# Patient Record
Sex: Male | Born: 1970 | Marital: Married | Smoking: Former smoker
Health system: Southern US, Community
[De-identification: ages and names within clinical notes are randomized; demographics above are authoritative.]

## PROBLEM LIST (undated history)

## (undated) DIAGNOSIS — IMO0001 Reserved for inherently not codable concepts without codable children: Secondary | ICD-10-CM

## (undated) DIAGNOSIS — J302 Other seasonal allergic rhinitis: Secondary | ICD-10-CM

## (undated) DIAGNOSIS — S0291XA Unspecified fracture of skull, initial encounter for closed fracture: Secondary | ICD-10-CM

## (undated) DIAGNOSIS — E785 Hyperlipidemia, unspecified: Secondary | ICD-10-CM

## (undated) DIAGNOSIS — R030 Elevated blood-pressure reading, without diagnosis of hypertension: Secondary | ICD-10-CM

## (undated) HISTORY — DX: Other seasonal allergic rhinitis: J30.2

## (undated) HISTORY — DX: Reserved for inherently not codable concepts without codable children: IMO0001

## (undated) HISTORY — PX: STRABISMUS SURGERY: SHX218

## (undated) HISTORY — DX: Elevated blood-pressure reading, without diagnosis of hypertension: R03.0

## (undated) HISTORY — DX: Hyperlipidemia, unspecified: E78.5

## (undated) HISTORY — DX: Unspecified fracture of skull, initial encounter for closed fracture: S02.91XA

---

## 2007-02-08 ENCOUNTER — Encounter (INDEPENDENT_AMBULATORY_CARE_PROVIDER_SITE_OTHER): Payer: Self-pay | Admitting: *Deleted

## 2007-02-08 ENCOUNTER — Ambulatory Visit: Payer: Self-pay | Admitting: Family Medicine

## 2007-02-08 DIAGNOSIS — E785 Hyperlipidemia, unspecified: Secondary | ICD-10-CM | POA: Insufficient documentation

## 2007-02-08 DIAGNOSIS — R030 Elevated blood-pressure reading, without diagnosis of hypertension: Secondary | ICD-10-CM | POA: Insufficient documentation

## 2007-02-08 LAB — CONVERTED CEMR LAB
CO2: 29 meq/L (ref 19–32)
Cholesterol: 215 mg/dL (ref 0–200)
Creatinine, Ser: 1.1 mg/dL (ref 0.4–1.5)
Direct LDL: 160.5 mg/dL
Glucose, Bld: 88 mg/dL (ref 70–99)
HDL: 34.9 mg/dL — ABNORMAL LOW (ref >39.0)
Potassium: 4.1 meq/L (ref 3.5–5.1)
Sodium: 139 meq/L (ref 135–145)
Total Bilirubin: 1.5 mg/dL — ABNORMAL HIGH (ref 0.3–1.2)
Total Protein: 7.1 g/dL (ref 6.0–8.3)

## 2007-05-16 DIAGNOSIS — S0291XA Unspecified fracture of skull, initial encounter for closed fracture: Secondary | ICD-10-CM

## 2007-05-16 HISTORY — DX: Unspecified fracture of skull, initial encounter for closed fracture: S02.91XA

## 2007-08-31 ENCOUNTER — Emergency Department: Payer: Self-pay | Admitting: Emergency Medicine

## 2009-03-18 IMAGING — CT CT HEAD WITHOUT CONTRAST
2 of 4 series · 15 of 30 positions shown, 18 images · non-contrast
Comparison: none

REASON FOR EXAM: closed head injury
COMMENTS:

PROCEDURE:     CT  - CT HEAD WITHOUT CONTRAST  - August 31, 2007  [DATE]
RESULT:     Comparison: No available comparison exam.
Procedure: CT examination of the head was performed without intravenous
contrast. Collimation is 5 mm. 2 mm axial, coronal, and sagittal reformats
were made.

[Series 4: bone · axial · 0.48mm/px · z∈[-37,+41]mm · 5 of 115 slices shown]
[im 11/115  bone]
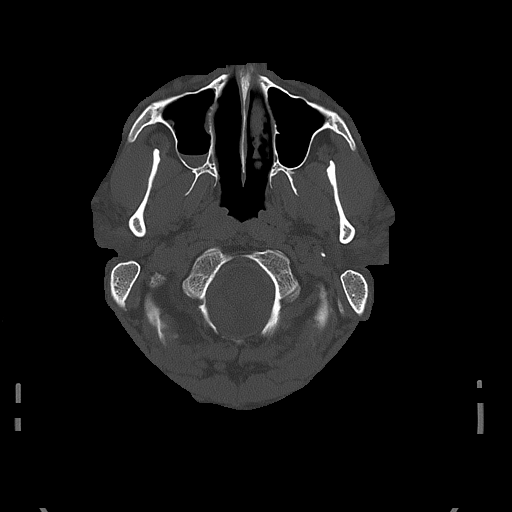
[im 21/115  bone]
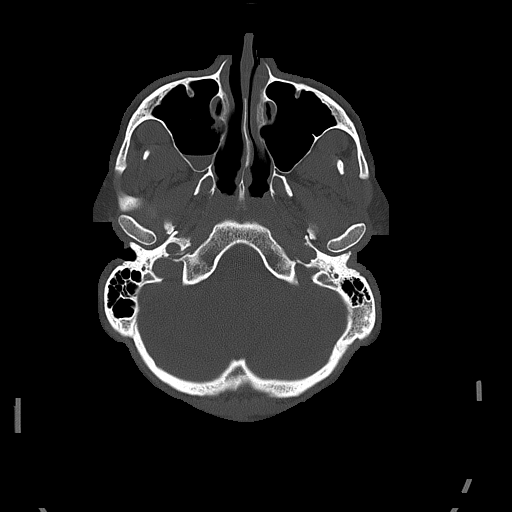
[im 42/115  bone]
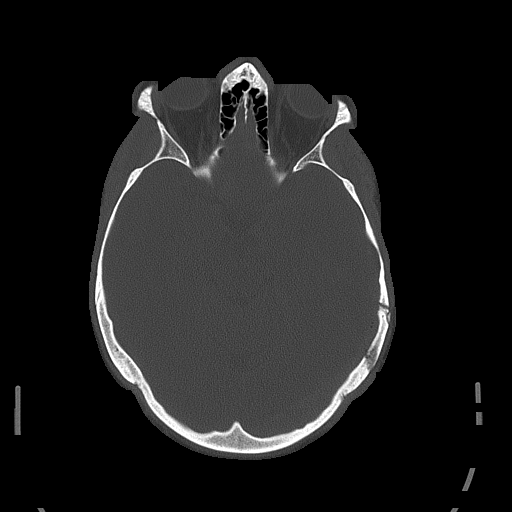
[im 52/115  bone]
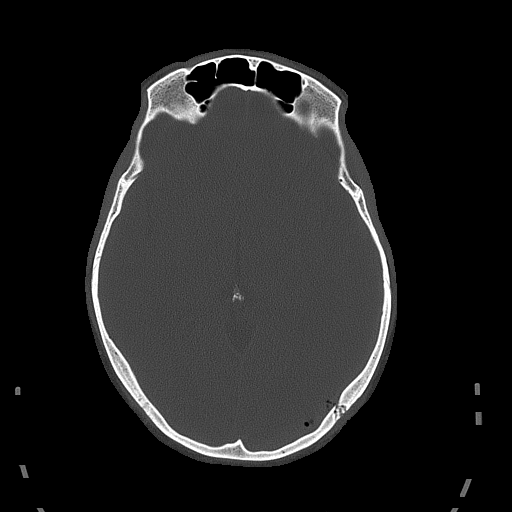
[im 63/115  bone]
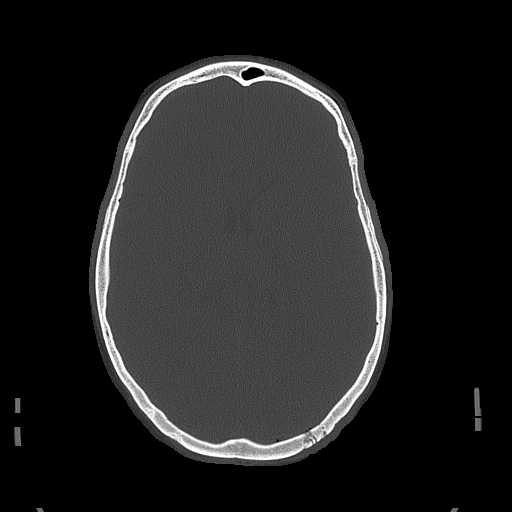

[Series 5: without thin · axial · non-contrast · 0.48mm/px · z∈[-37,+102]mm · 10 of 115 slices shown, 13 images]
[im 11/115  brain]
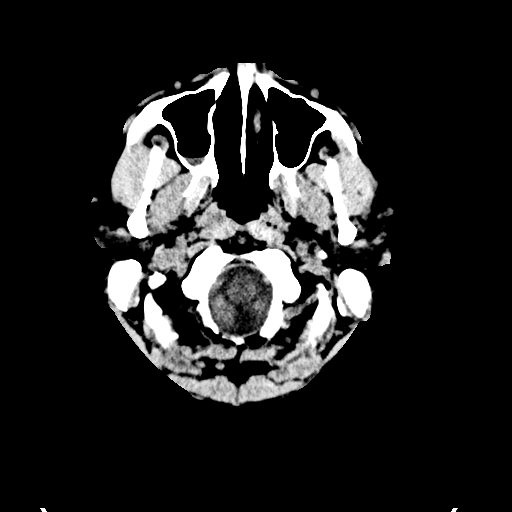
[im 11/115  bone]
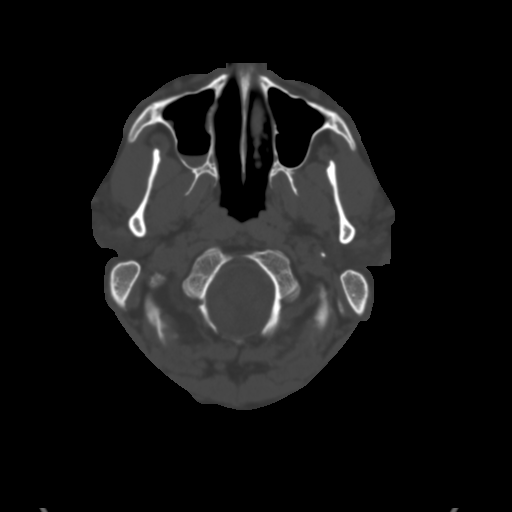
[im 21/115  brain]
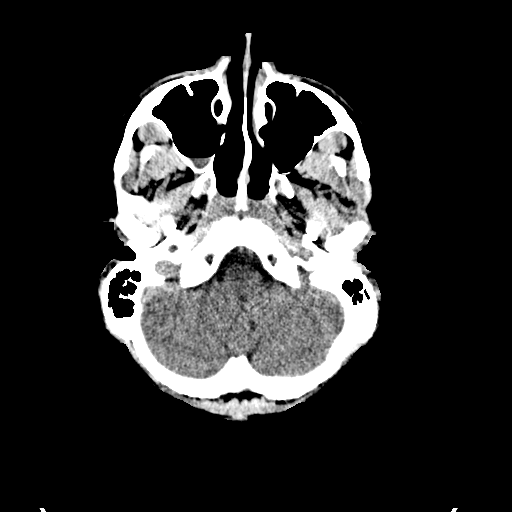
[im 32/115  brain]
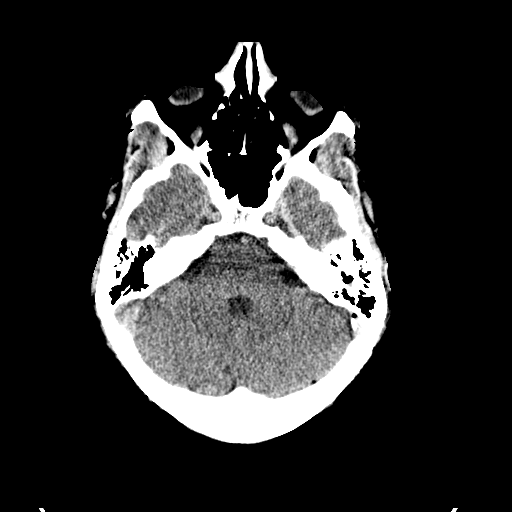
[im 42/115  brain]
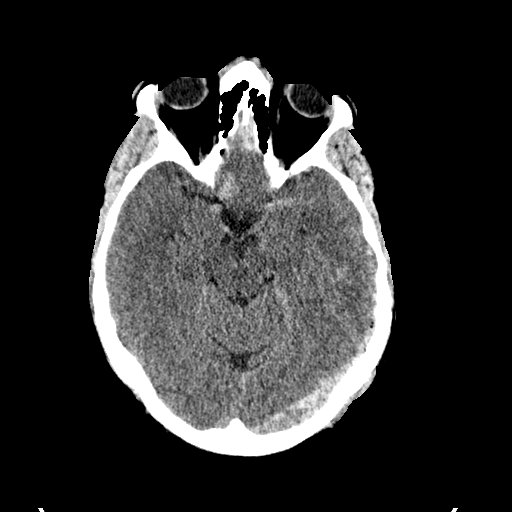
[im 52/115  brain]
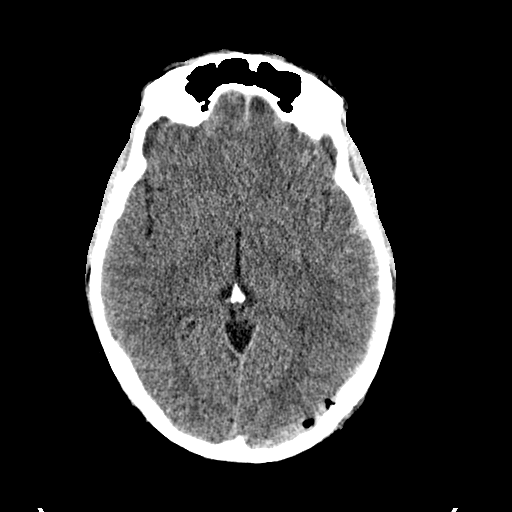
[im 52/115  bone]
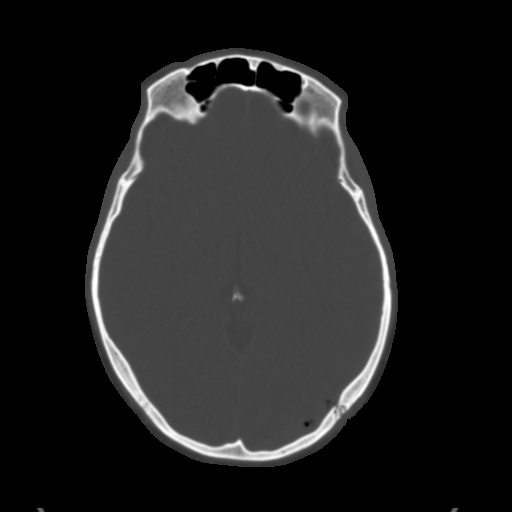
[im 63/115  brain]
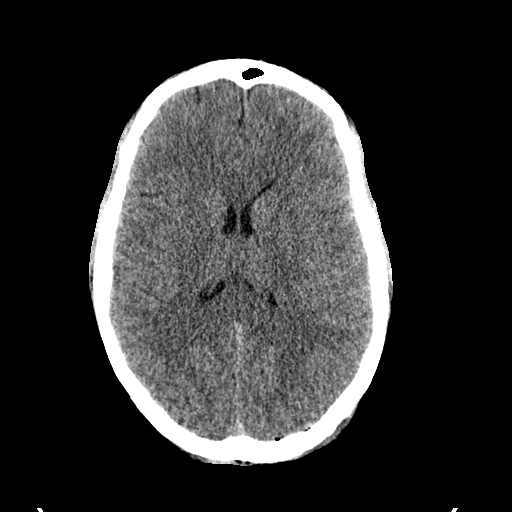
[im 73/115  brain]
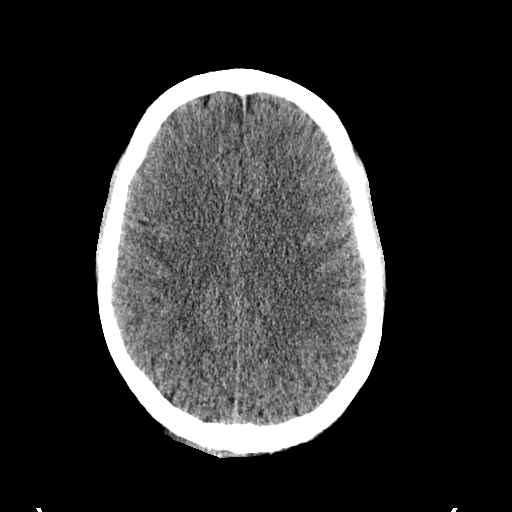
[im 83/115  brain]
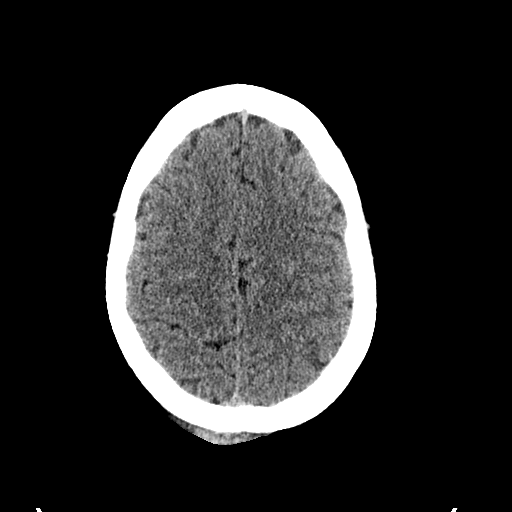
[im 94/115  brain]
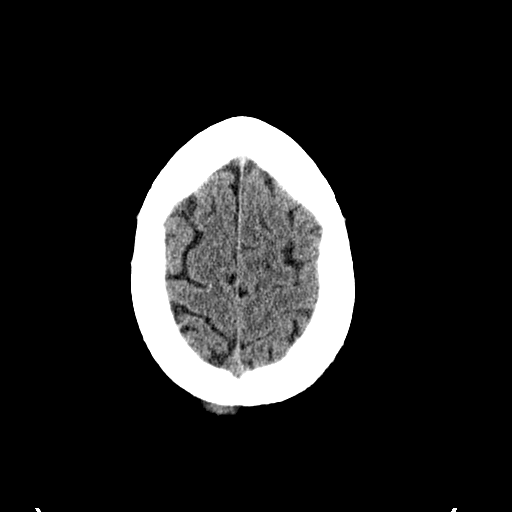
[im 94/115  bone]
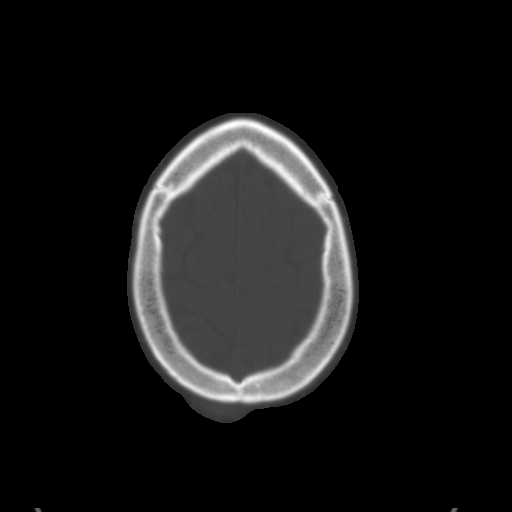
[im 104/115  brain]
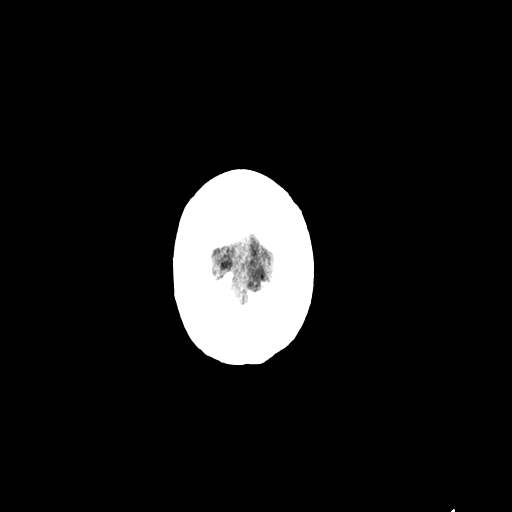

[15 of 30 positions shown; findings below may reference images not displayed]

FINDINGS: Scalp soft tissue hematoma is seen involving the posterior skull. There are
at least 2 areas of nondisplaced fractures involving the left temporal
calvarium. There is mild widening of the left lambdoid suture. There is an
acute epidural/subdural hematoma involving the left posterior parietal
occipital region with associated epidural/subdural gas. It measures up to 9
mm in thickness. There is no significant mass-effect.

Minimal high attenuation involving few left frontal parietal sulci may
represent small amount of subarachnoid blood. The gray and white matters are
differentiated. There is no ventricular dilatation. The basal cisterns are
patent.

There is a transverse left temporal bone fracture through the mastoid air
cells at the level of the ossicular chain. There is no gross disruption of
the ossicular chain. There is partial opacification of the right mastoid air
cells. Small amount of fluid is seen in the right maxillary sinus. There is
no evidence of a maxillofacial fracture. The partially visualized paranasal
sinuses and right mastoid air cells are unremarkable.
IMPRESSION: 1. Nondisplaced left temporal calvarial fractures with left
parieto-occipital epidural/subdural hematoma and gas.
2. Possible small amount of subarachnoid blood involving few left frontal
parietal sulci.
3. Transverse left temporal bone fracture through the mastoid air cells at
the level of the ossicular chains. No gross disruption of the ossicular
chain. There is partial opacification of the left mastoid air cells. There
is a small ossific fragment posterior to the left mastoid air cells. Donor
site is uncertain.

Preliminary report was faxed to the emergency room by the night radiologist
shortly after the study was performed. Findings were called to Dr. Sebahttin
at [DATE] on 08/31/2007 by night radiologist.

## 2009-03-18 IMAGING — CR DG CHEST 1V PORT
1 series · 1 of 1 positions shown · non-contrast
Comparison: none

REASON FOR EXAM: post intubation
COMMENTS:

PROCEDURE:     DXR - DXR PORTABLE CHEST SINGLE VIEW  - August 31, 2007  [DATE]
RESULT:     Comparison: No available comparison exam.

[view not recorded]
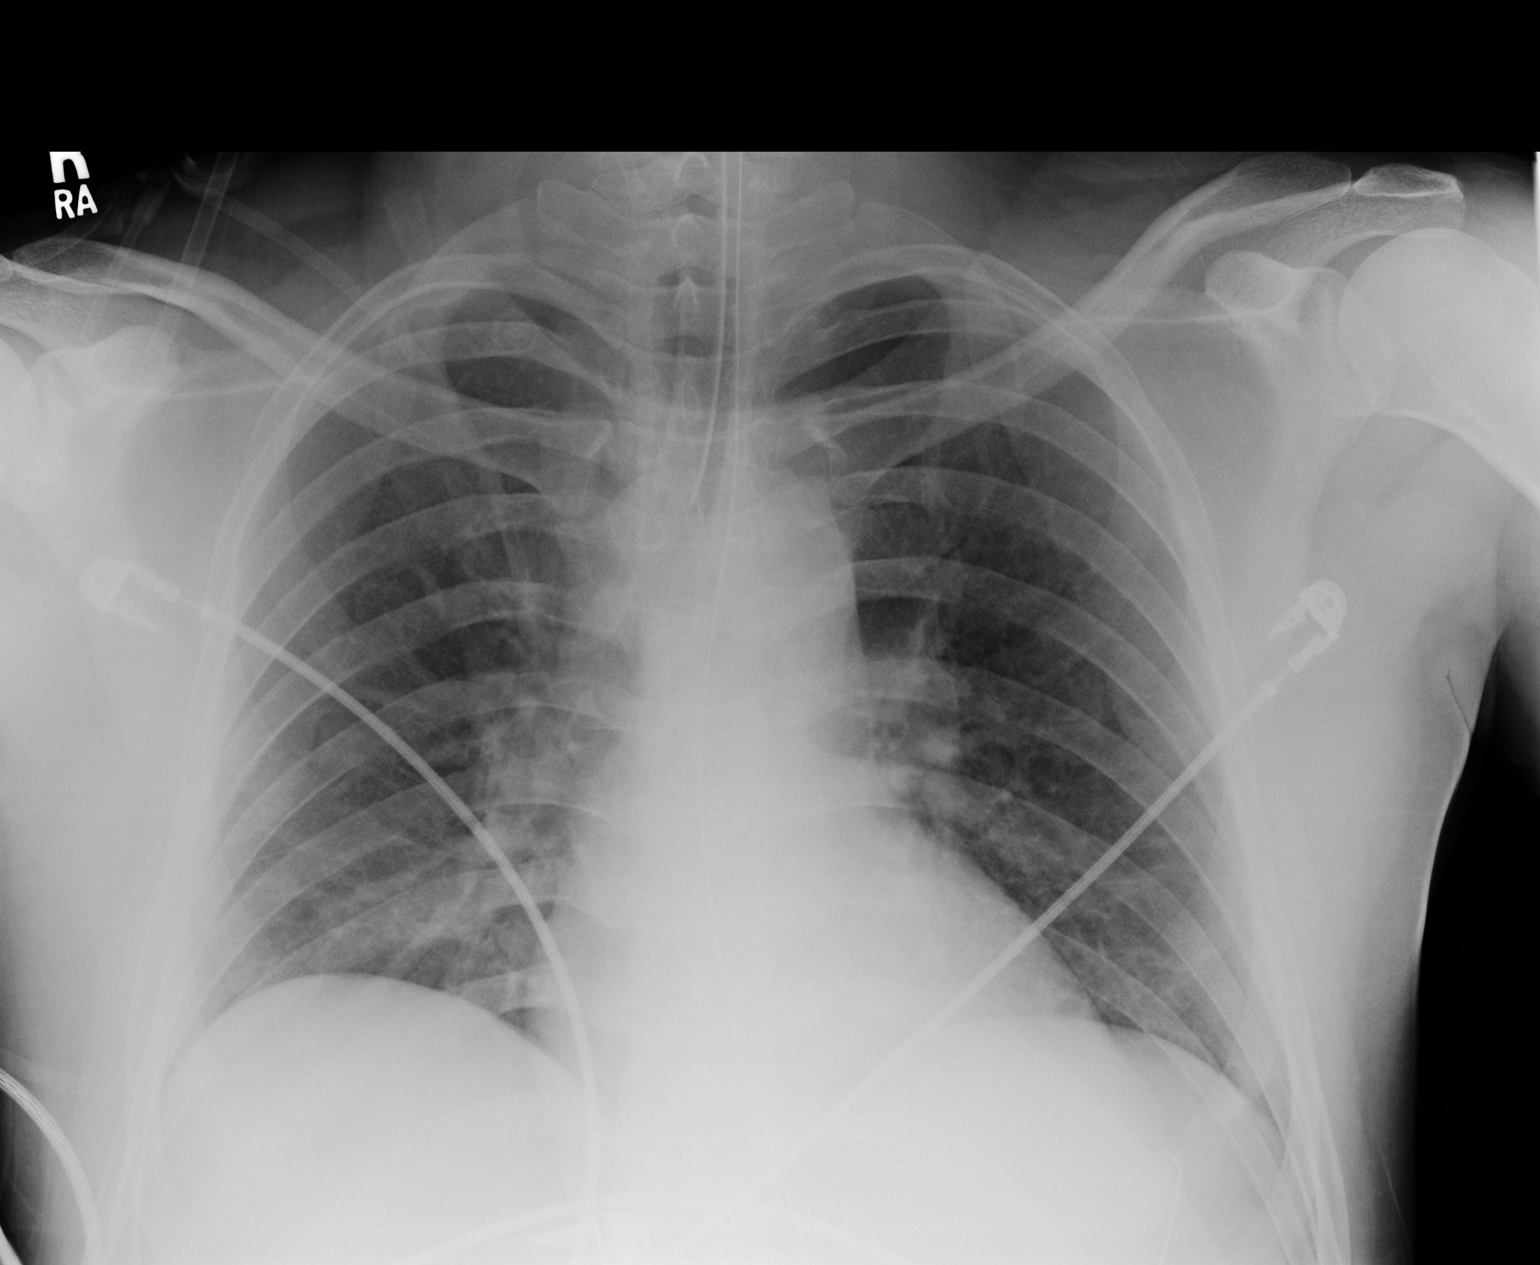

[1 of 1 positions shown; findings below may reference images not displayed]

FINDINGS: Endotracheal tube is seen with the distal tip 3.5 cm above the carina.
Nasogastric tube is seen extending below the diaphragm. Its distal tip is
not fully imaged. No significant pulmonary consolidation, pulmonary edema,
pleural effusion, nor pneumothorax. The cardiomediastinal silhouette is
within normal limits. No grossly displaced rib fracture is noted.
IMPRESSION: 1. Please see above.

## 2012-07-30 ENCOUNTER — Encounter: Payer: Self-pay | Admitting: Family Medicine

## 2012-07-30 ENCOUNTER — Ambulatory Visit (INDEPENDENT_AMBULATORY_CARE_PROVIDER_SITE_OTHER): Payer: Managed Care, Other (non HMO) | Admitting: Family Medicine

## 2012-07-30 VITALS — BP 134/80 | HR 60 | Temp 98.1°F | Ht 72.0 in | Wt 218.8 lb

## 2012-07-30 DIAGNOSIS — E663 Overweight: Secondary | ICD-10-CM

## 2012-07-30 DIAGNOSIS — I10 Essential (primary) hypertension: Secondary | ICD-10-CM

## 2012-07-30 DIAGNOSIS — Z0000 Encounter for general adult medical examination without abnormal findings: Secondary | ICD-10-CM

## 2012-07-30 DIAGNOSIS — E785 Hyperlipidemia, unspecified: Secondary | ICD-10-CM

## 2012-07-30 LAB — LIPID PANEL
Cholesterol: 217 mg/dL — ABNORMAL HIGH (ref 0–200)
Total CHOL/HDL Ratio: 6

## 2012-07-30 LAB — BASIC METABOLIC PANEL
CO2: 26 mEq/L (ref 19–32)
Chloride: 100 mEq/L (ref 96–112)
Creatinine, Ser: 1 mg/dL (ref 0.4–1.5)

## 2012-07-30 NOTE — Assessment & Plan Note (Signed)
Body mass index is 29.66 kg/(m^2). Discussed portion sizes. Encouraged continued activity.

## 2012-07-30 NOTE — Progress Notes (Signed)
Subjective:    Patient ID: Tony Hines, male    DOB: [birth year], 42 y.o.   MRN: [id number]  HPI CC: re establish prior pt of Dr. Ermalene Searing (saw once)  New pt to re establish. Prior saw Dr. Clarene Duke.  HLD - h/o mildly elevated in past.  No recent  Blood work. Borderline elevated BP - never on bp meds.  Has controlled with diet. Neck aches.  Hockey player.  Glucosamine really helps.  Stooling - occasionally notes stool residual after has BM and has wiped clean.  Hasn't noticed relation to any specific food.  Feels completely evacuates.  Normal soft stools 1+ times per day.  No trouble with constipation, diarrhea, blood in stool or weight loss.  Prior more frequent, improved with starting fiber supplement daily.  Preventative: Last CPE - about 2 yrs ago Td 2006 Flu shot - does not receive  Caffeine: sodas Married; lives with wife and 2 children, 1 dog Occupation: Oceanographer Edu: college Activity: runs, lifts weights Diet: poor, some water, some fruits/vegetables, regular fast food/convenience  Some trouble with portions. Body mass index is 29.66 kg/(m^2).   Medications and allergies reviewed and updated in chart.  Past histories reviewed and updated if relevant as below. Patient Active Problem List  Diagnosis  . HYPERLIPIDEMIA  . HYPERTENSION   Past Medical History  Diagnosis Date  . HLD (hyperlipidemia)   . Elevated BP   . Seasonal allergies     spring, congestion predominant  . Closed skull fracture 2009    fell off curb   Past Surgical History  Procedure Laterality Date  . Strabismus surgery      x3, left eye   History  Substance Use Topics  . Smoking status: Former Smoker -- 0.50 packs/day for 20 years    Types: Cigarettes    Quit date: 05/15/2010  . Smokeless tobacco: Never Used  . Alcohol Use: Yes     Comment: Occasional   Family History  Problem Relation Age of Onset  . Hypertension Father   . CAD Father 30    triple bypass  . Stroke Neg  Hx   . Cancer Neg Hx   . Diabetes Neg Hx    No Known Allergies No current outpatient prescriptions on file prior to visit.   No current facility-administered medications on file prior to visit.     Review of Systems  Constitutional: Negative for fever, chills, activity change, appetite change, fatigue and unexpected weight change.  HENT: Negative for hearing loss and neck pain.   Eyes: Negative for visual disturbance (L lazy eye).  Respiratory: Negative for cough, chest tightness, shortness of breath and wheezing.   Cardiovascular: Negative for chest pain, palpitations and leg swelling.  Gastrointestinal: Negative for nausea, vomiting, abdominal pain, diarrhea, constipation, blood in stool and abdominal distention.  Genitourinary: Negative for hematuria and difficulty urinating.  Musculoskeletal: Negative for myalgias and arthralgias.  Skin: Negative for rash.  Neurological: Negative for dizziness, seizures, syncope and headaches.  Hematological: Does not bruise/bleed easily.  Psychiatric/Behavioral: Negative for dysphoric mood. The patient is not nervous/anxious.        Objective:   Physical Exam  Nursing note and vitals reviewed. Constitutional: He is oriented to person, place, and time. He appears well-developed and well-nourished. No distress.  HENT:  Head: Normocephalic and atraumatic.  Right Ear: Hearing, tympanic membrane, external ear and ear canal normal.  Left Ear: Hearing, tympanic membrane, external ear and ear canal normal.  Nose: Nose normal.  Mouth/Throat:  Oropharynx is clear and moist. No oropharyngeal exudate.  Eyes: Conjunctivae and EOM are normal. Pupils are equal, round, and reactive to light. No scleral icterus.  L lazy eye  Neck: Normal range of motion. Neck supple. No thyromegaly present.  Cardiovascular: Normal rate, regular rhythm, normal heart sounds and intact distal pulses.   No murmur heard. Pulses:      Radial pulses are 2+ on the right side,  and 2+ on the left side.  Pulmonary/Chest: Effort normal and breath sounds normal. No respiratory distress. He has no wheezes. He has no rales.  Abdominal: Soft. Bowel sounds are normal. He exhibits no distension and no mass. There is no tenderness. There is no rebound and no guarding.  Musculoskeletal: Normal range of motion. He exhibits no edema.  Lymphadenopathy:    He has no cervical adenopathy.  Neurological: He is alert and oriented to person, place, and time.  CN grossly intact, station and gait intact  Skin: Skin is warm and dry. No rash noted.  Psychiatric: He has a normal mood and affect. His behavior is normal. Judgment and thought content normal.       Assessment & Plan:

## 2012-07-30 NOTE — Patient Instructions (Addendum)
Good to meet you today, call us with questions. Blood work today. Return as needed or in 1-2 years for next physical. Work on portion sizes. Keep an eye on bowels - if further changes, worsening please return to see me.

## 2012-07-30 NOTE — Assessment & Plan Note (Signed)
BP Readings from Last 3 Encounters:  07/30/12 134/80  02/08/07 120/66  never on meds.  Discussed potassium rich foods and avoiding sodium and increasing water intake.

## 2012-07-30 NOTE — Assessment & Plan Note (Signed)
Chronic, mild.  Diet controlled in past. Recheck today. Discussed weight loss.

## 2012-08-23 DIAGNOSIS — Z0000 Encounter for general adult medical examination without abnormal findings: Secondary | ICD-10-CM | POA: Insufficient documentation

## 2012-08-23 NOTE — Addendum Note (Signed)
Addended by: Eustaquio Boyden on: 08/23/2012 01:51 PM   Modules accepted: Level of Service

## 2012-08-23 NOTE — Assessment & Plan Note (Signed)
Preventative protocols reviewed and updated unless pt declined. Discussed healthy diet and lifestyle.  

## 2015-07-11 ENCOUNTER — Other Ambulatory Visit: Payer: Self-pay | Admitting: Family Medicine

## 2015-07-11 DIAGNOSIS — E785 Hyperlipidemia, unspecified: Secondary | ICD-10-CM

## 2015-07-13 ENCOUNTER — Other Ambulatory Visit (INDEPENDENT_AMBULATORY_CARE_PROVIDER_SITE_OTHER): Payer: BLUE CROSS/BLUE SHIELD

## 2015-07-13 ENCOUNTER — Other Ambulatory Visit: Payer: Managed Care, Other (non HMO)

## 2015-07-13 DIAGNOSIS — E785 Hyperlipidemia, unspecified: Secondary | ICD-10-CM

## 2015-07-13 LAB — COMPREHENSIVE METABOLIC PANEL
ALBUMIN: 4.5 g/dL (ref 3.5–5.2)
ALK PHOS: 73 U/L (ref 39–117)
ALT: 16 U/L (ref 0–53)
AST: 19 U/L (ref 0–37)
BILIRUBIN TOTAL: 0.6 mg/dL (ref 0.2–1.2)
BUN: 16 mg/dL (ref 6–23)
CALCIUM: 9.5 mg/dL (ref 8.4–10.5)
CO2: 31 mEq/L (ref 19–32)
CREATININE: 0.99 mg/dL (ref 0.40–1.50)
Chloride: 105 mEq/L (ref 96–112)
GFR: 87.6 mL/min (ref >60.00)
Glucose, Bld: 97 mg/dL (ref 70–99)
Potassium: 4.6 mEq/L (ref 3.5–5.1)
Sodium: 139 mEq/L (ref 135–145)
Total Protein: 7.2 g/dL (ref 6.0–8.3)

## 2015-07-13 LAB — LIPID PANEL
CHOLESTEROL: 184 mg/dL (ref 0–200)
HDL: 36.9 mg/dL — ABNORMAL LOW (ref >39.00)
LDL Cholesterol: 123 mg/dL — ABNORMAL HIGH (ref 0–99)
NonHDL: 146.66
TRIGLYCERIDES: 117 mg/dL (ref 0.0–149.0)
Total CHOL/HDL Ratio: 5
VLDL: 23.4 mg/dL (ref 0.0–40.0)

## 2015-07-15 ENCOUNTER — Ambulatory Visit (INDEPENDENT_AMBULATORY_CARE_PROVIDER_SITE_OTHER): Payer: BLUE CROSS/BLUE SHIELD | Admitting: Family Medicine

## 2015-07-15 ENCOUNTER — Encounter: Payer: Self-pay | Admitting: Family Medicine

## 2015-07-15 VITALS — BP 118/76 | HR 60 | Temp 97.8°F | Wt 212.0 lb

## 2015-07-15 DIAGNOSIS — Z0000 Encounter for general adult medical examination without abnormal findings: Secondary | ICD-10-CM

## 2015-07-15 DIAGNOSIS — E785 Hyperlipidemia, unspecified: Secondary | ICD-10-CM

## 2015-07-15 NOTE — Patient Instructions (Signed)
You are doing great today! Return as needed or in 1-2 year for next physical.  Health Maintenance, Male A healthy lifestyle and preventative care can promote health and wellness.  Maintain regular health, dental, and eye exams.  Eat a healthy diet. Foods like vegetables, fruits, whole grains, low-fat dairy products, and lean protein foods contain the nutrients you need and are low in calories. Decrease your intake of foods high in solid fats, added sugars, and salt. Get information about a proper diet from your health care provider, if necessary.  Regular physical exercise is one of the most important things you can do for your health. Most adults should get at least 150 minutes of moderate-intensity exercise (any activity that increases your heart rate and causes you to sweat) each week. In addition, most adults need muscle-strengthening exercises on 2 or more days a week.   Maintain a healthy weight. The body mass index (BMI) is a screening tool to identify possible weight problems. It provides an estimate of body fat based on height and weight. Your health care provider can find your BMI and can help you achieve or maintain a healthy weight. For males 20 years and older:  A BMI below 18.5 is considered underweight.  A BMI of 18.5 to 24.9 is normal.  A BMI of 25 to 29.9 is considered overweight.  A BMI of 30 and above is considered obese.  Maintain normal blood lipids and cholesterol by exercising and minimizing your intake of saturated fat. Eat a balanced diet with plenty of fruits and vegetables. Blood tests for lipids and cholesterol should begin at age 9 and be repeated every 5 years. If your lipid or cholesterol levels are high, you are over age 8, or you are at high risk for heart disease, you may need your cholesterol levels checked more frequently.Ongoing high lipid and cholesterol levels should be treated with medicines if diet and exercise are not working.  If you smoke, find  out from your health care provider how to quit. If you do not use tobacco, do not start.  Lung cancer screening is recommended for adults aged 55-80 years who are at high risk for developing lung cancer because of a history of smoking. A yearly low-dose CT scan of the lungs is recommended for people who have at least a 30-pack-year history of smoking and are current smokers or have quit within the past 15 years. A pack year of smoking is smoking an average of 1 pack of cigarettes a day for 1 year (for example, a 30-pack-year history of smoking could mean smoking 1 pack a day for 30 years or 2 packs a day for 15 years). Yearly screening should continue until the smoker has stopped smoking for at least 15 years. Yearly screening should be stopped for people who develop a health problem that would prevent them from having lung cancer treatment.  If you choose to drink alcohol, do not have more than 2 drinks per day. One drink is considered to be 12 oz (360 mL) of beer, 5 oz (150 mL) of wine, or 1.5 oz (45 mL) of liquor.  Avoid the use of street drugs. Do not share needles with anyone. Ask for help if you need support or instructions about stopping the use of drugs.  High blood pressure causes heart disease and increases the risk of stroke. High blood pressure is more likely to develop in:  People who have blood pressure in the end of the normal range (100-139/85-89  mm Hg).  People who are overweight or obese.  People who are African American.  If you are 34-44 years of age, have your blood pressure checked every 3-5 years. If you are 31 years of age or older, have your blood pressure checked every year. You should have your blood pressure measured twice--once when you are at a hospital or clinic, and once when you are not at a hospital or clinic. Record the average of the two measurements. To check your blood pressure when you are not at a hospital or clinic, you can use:  An automated blood pressure  machine at a pharmacy.  A home blood pressure monitor.  If you are 45-44 years old, ask your health care provider if you should take aspirin to prevent heart disease.  Diabetes screening involves taking a blood sample to check your fasting blood sugar level. This should be done once every 3 years after age 41 if you are at a normal weight and without risk factors for diabetes. Testing should be considered at a younger age or be carried out more frequently if you are overweight and have at least 1 risk factor for diabetes.  Colorectal cancer can be detected and often prevented. Most routine colorectal cancer screening begins at the age of 8 and continues through age 72. However, your health care provider may recommend screening at an earlier age if you have risk factors for colon cancer. On a yearly basis, your health care provider may provide home test kits to check for hidden blood in the stool. A small camera at the end of a tube may be used to directly examine the colon (sigmoidoscopy or colonoscopy) to detect the earliest forms of colorectal cancer. Talk to your health care provider about this at age 26 when routine screening begins. A direct exam of the colon should be repeated every 5-10 years through age 71, unless early forms of precancerous polyps or small growths are found.  People who are at an increased risk for hepatitis B should be screened for this virus. You are considered at high risk for hepatitis B if:  You were born in a country where hepatitis B occurs often. Talk with your health care provider about which countries are considered high risk.  Your parents were born in a high-risk country and you have not received a shot to protect against hepatitis B (hepatitis B vaccine).  You have HIV or AIDS.  You use needles to inject street drugs.  You live with, or have sex with, someone who has hepatitis B.  You are a man who has sex with other men (MSM).  You get hemodialysis  treatment.  You take certain medicines for conditions like cancer, organ transplantation, and autoimmune conditions.  Hepatitis C blood testing is recommended for all people born from [birth year] through 1964 and any individual with known risk factors for hepatitis C.  Healthy men should no longer receive prostate-specific antigen (PSA) blood tests as part of routine cancer screening. Talk to your health care provider about prostate cancer screening.  Testicular cancer screening is not recommended for adolescents or adult males who have no symptoms. Screening includes self-exam, a health care provider exam, and other screening tests. Consult with your health care provider about any symptoms you have or any concerns you have about testicular cancer.  Practice safe sex. Use condoms and avoid high-risk sexual practices to reduce the spread of sexually transmitted infections (STIs).  You should be screened for STIs, including gonorrhea  and chlamydia if:  You are sexually active and are younger than 24 years.  You are older than 24 years, and your health care provider tells you that you are at risk for this type of infection.  Your sexual activity has changed since you were last screened, and you are at an increased risk for chlamydia or gonorrhea. Ask your health care provider if you are at risk.  If you are at risk of being infected with HIV, it is recommended that you take a prescription medicine daily to prevent HIV infection. This is called pre-exposure prophylaxis (PrEP). You are considered at risk if:  You are a man who has sex with other men (MSM).  You are a heterosexual man who is sexually active with multiple partners.  You take drugs by injection.  You are sexually active with a partner who has HIV.  Talk with your health care provider about whether you are at high risk of being infected with HIV. If you choose to begin PrEP, you should first be tested for HIV. You should then be tested  every 3 months for as long as you are taking PrEP.  Use sunscreen. Apply sunscreen liberally and repeatedly throughout the day. You should seek shade when your shadow is shorter than you. Protect yourself by wearing long sleeves, pants, a wide-brimmed hat, and sunglasses year round whenever you are outdoors.  Tell your health care provider of new moles or changes in moles, especially if there is a change in shape or color. Also, tell your health care provider if a mole is larger than the size of a pencil eraser.  A one-time screening for abdominal aortic aneurysm (AAA) and surgical repair of large AAAs by ultrasound is recommended for men aged 8-75 years who are current or former smokers.  Stay current with your vaccines (immunizations).   This information is not intended to replace advice given to you by your health care provider. Make sure you discuss any questions you have with your health care provider.   Document Released: 10/28/2007 Document Revised: 05/22/2014 Document Reviewed: 09/26/2010 Elsevier Interactive Patient Education [employer].

## 2015-07-15 NOTE — Assessment & Plan Note (Signed)
Preventative protocols reviewed and updated unless pt declined. Discussed healthy diet and lifestyle.  

## 2015-07-15 NOTE — Progress Notes (Signed)
Pre visit review using our clinic review tool, if applicable. No additional management support is needed unless otherwise documented below in the visit note. 

## 2015-07-15 NOTE — Assessment & Plan Note (Signed)
Improved - mild off meds. Discussed healthy diet changes. fmhx CAD father age 44yo.

## 2015-07-15 NOTE — Progress Notes (Signed)
BP 118/76 mmHg  Pulse 60  Temp(Src) 97.8 F (36.6 C) (Oral)  Wt 212 lb (96.163 kg)   CC: CPE  Subjective:    Patient ID: Tony Hines, male    DOB: 11/07/1970, 45 y.o.   MRN: [id number]  HPI: Tony Hines is a 45 y.o. male presenting on 07/15/2015 for Annual Exam   Last seen here 07/2012 CPE.   Increased running - to run [facility] on Saturday. Second one this year. Overall feels well.  Fmhx CAD - father with quad bypass surgery.   Preventative: Flu shot - does not receive Td 2006, 2010 Seat belt use discussed Sunscreen use discussed. No changing moles on skin.  Caffeine: sodas Married; lives with wife and 2 children, 1 dog Occupation: Oceanographer Edu: college Activity: runner, 6mi several times a week Diet: water daily, fruits/vegetables daily  Relevant past medical, surgical, family and social history reviewed and updated as indicated. Interim medical history since our last visit reviewed. Allergies and medications reviewed and updated. Current Outpatient Prescriptions on File Prior to Visit  Medication Sig  . FIBER COMPLETE PO Take 2 tablets by mouth daily.  . Glucosamine-Chondroit-Vit C-Mn (GLUCOSAMINE 1500 COMPLEX) CAPS Take 2 capsules by mouth daily.  . Multiple Vitamin (MULTIVITAMIN) tablet Take 1 tablet by mouth daily.   No current facility-administered medications on file prior to visit.    Review of Systems  Constitutional: Negative for fever, chills, activity change, appetite change, fatigue and unexpected weight change.  HENT: Positive for congestion and sinus pressure. Negative for hearing loss.        Sinus infection last week resolved without abx  Eyes: Negative for visual disturbance.  Respiratory: Negative for cough, chest tightness, shortness of breath and wheezing.   Cardiovascular: Negative for chest pain, palpitations and leg swelling.  Gastrointestinal: Negative for nausea, vomiting, abdominal pain, diarrhea, constipation,  blood in stool and abdominal distention.  Genitourinary: Negative for hematuria and difficulty urinating.  Musculoskeletal: Negative for myalgias, arthralgias and neck pain.  Skin: Negative for rash.  Neurological: Positive for headaches (recent sinusitis). Negative for dizziness, seizures and syncope.  Hematological: Negative for adenopathy. Does not bruise/bleed easily.  Psychiatric/Behavioral: Negative for dysphoric mood. The patient is not nervous/anxious.    Per HPI unless specifically indicated in ROS section     Objective:    BP 118/76 mmHg  Pulse 60  Temp(Src) 97.8 F (36.6 C) (Oral)  Wt 212 lb (96.163 kg)  Wt Readings from Last 3 Encounters:  07/15/15 212 lb (96.163 kg)  07/30/12 218 lb 12 oz (99.224 kg)  02/08/07 203 lb (92.08 kg)   Body mass index is 28.75 kg/(m^2).  Physical Exam  Constitutional: He is oriented to person, place, and time. He appears well-developed and well-nourished. No distress.  HENT:  Head: Normocephalic and atraumatic.  Right Ear: Hearing, tympanic membrane, external ear and ear canal normal.  Left Ear: Hearing, tympanic membrane, external ear and ear canal normal.  Nose: Nose normal.  Mouth/Throat: Uvula is midline, oropharynx is clear and moist and mucous membranes are normal. No oropharyngeal exudate, posterior oropharyngeal edema or posterior oropharyngeal erythema.  Eyes: Conjunctivae and EOM are normal. Pupils are equal, round, and reactive to light. No scleral icterus.  Neck: Normal range of motion. Neck supple. No thyromegaly present.  Cardiovascular: Normal rate, regular rhythm, normal heart sounds and intact distal pulses.   No murmur heard. Pulses:      Radial pulses are 2+ on the right side, and 2+ on  the left side.  Pulmonary/Chest: Effort normal and breath sounds normal. No respiratory distress. He has no wheezes. He has no rales.  Abdominal: Soft. Bowel sounds are normal. He exhibits no distension and no mass. There is no  tenderness. There is no rebound and no guarding.  Musculoskeletal: Normal range of motion. He exhibits no edema.  Lymphadenopathy:    He has no cervical adenopathy.  Neurological: He is alert and oriented to person, place, and time.  CN grossly intact, station and gait intact  Skin: Skin is warm and dry. No rash noted.  Psychiatric: He has a normal mood and affect. His behavior is normal. Judgment and thought content normal.  Nursing note and vitals reviewed.  Results for orders placed or performed in visit on 07/13/15  Lipid panel  Result Value Ref Range   Cholesterol 184 0 - 200 mg/dL   Triglycerides 098.1 0.0 - 149.0 mg/dL   HDL 19.14 (L) >78.29 mg/dL   VLDL 56.2 0.0 - 13.0 mg/dL   LDL Cholesterol 865 (H) 0 - 99 mg/dL   Total CHOL/HDL Ratio 5    NonHDL 146.66   Comprehensive metabolic panel  Result Value Ref Range   Sodium 139 135 - 145 mEq/L   Potassium 4.6 3.5 - 5.1 mEq/L   Chloride 105 96 - 112 mEq/L   CO2 31 19 - 32 mEq/L   Glucose, Bld 97 70 - 99 mg/dL   BUN 16 6 - 23 mg/dL   Creatinine, Ser 7.84 0.40 - 1.50 mg/dL   Total Bilirubin 0.6 0.2 - 1.2 mg/dL   Alkaline Phosphatase 73 39 - 117 U/L   AST 19 0 - 37 U/L   ALT 16 0 - 53 U/L   Total Protein 7.2 6.0 - 8.3 g/dL   Albumin 4.5 3.5 - 5.2 g/dL   Calcium 9.5 8.4 - 69.6 mg/dL   GFR 29.52 >84.13 mL/min      Assessment & Plan:  bp better controlled with increased running. Problem List Items Addressed This Visit    HLD (hyperlipidemia)    Improved - mild off meds. Discussed healthy diet changes. fmhx CAD father age 74yo.      Healthcare maintenance - Primary    Preventative protocols reviewed and updated unless pt declined. Discussed healthy diet and lifestyle.           Follow up plan: Return in about 1 year (around 07/14/2016), or as needed, for annual exam, prior fasting for blood work.

## 2018-07-17 ENCOUNTER — Encounter: Payer: BLUE CROSS/BLUE SHIELD | Admitting: Family Medicine

## 2018-09-23 ENCOUNTER — Encounter: Payer: BLUE CROSS/BLUE SHIELD | Admitting: Family Medicine

## 2018-12-13 ENCOUNTER — Encounter: Payer: BLUE CROSS/BLUE SHIELD | Admitting: Family Medicine

## 2018-12-13 DIAGNOSIS — Z0289 Encounter for other administrative examinations: Secondary | ICD-10-CM
